# Patient Record
Sex: Female | Born: 1960 | Race: White | Hispanic: No | Marital: Married | Smoking: Former smoker
Health system: Southern US, Community
[De-identification: ages and names within clinical notes are randomized; demographics above are authoritative.]

## PROBLEM LIST (undated history)

## (undated) DIAGNOSIS — Z789 Other specified health status: Secondary | ICD-10-CM

## (undated) HISTORY — PX: DILATION AND CURETTAGE OF UTERUS: SHX78

---

## 2013-04-10 ENCOUNTER — Ambulatory Visit: Payer: Self-pay | Admitting: Internal Medicine

## 2016-04-08 ENCOUNTER — Other Ambulatory Visit: Payer: Self-pay | Admitting: Family Medicine

## 2016-04-08 ENCOUNTER — Telehealth: Payer: Self-pay | Admitting: Gastroenterology

## 2016-04-08 DIAGNOSIS — Z1231 Encounter for screening mammogram for malignant neoplasm of breast: Secondary | ICD-10-CM

## 2016-04-08 NOTE — Telephone Encounter (Signed)
Called patient. Voicemail is full. Will try calling again at a later time

## 2016-04-08 NOTE — Telephone Encounter (Signed)
colonoscopy

## 2016-04-10 ENCOUNTER — Other Ambulatory Visit: Payer: Self-pay

## 2016-04-10 NOTE — Telephone Encounter (Signed)
Received a referral from Dr. Ellison Hughs to schedule patient for Z12.11   Called patient and left a voicemail.

## 2016-04-14 ENCOUNTER — Telehealth: Payer: Self-pay | Admitting: Gastroenterology

## 2016-04-14 NOTE — Telephone Encounter (Signed)
Called patient and left a voicemail. Notification letter has been sent 

## 2016-04-14 NOTE — Telephone Encounter (Signed)
Patient is returning your phone call. 

## 2016-04-15 ENCOUNTER — Other Ambulatory Visit: Payer: Self-pay

## 2016-04-15 NOTE — Telephone Encounter (Signed)
Patient is returning call to schedule colonoscopy  

## 2016-04-15 NOTE — Telephone Encounter (Signed)
Screening Colonoscopy Z12.11 [facility] [id number] [employer]  Pre cert is not required

## 2016-04-15 NOTE — Telephone Encounter (Signed)
Gastroenterology Pre-Procedure Review  Request Date: 05/23/2016 Requesting Physician:   PATIENT REVIEW QUESTIONS: The patient responded to the following health history questions as indicated:    1. Are you having any GI issues? no 2. Do you have a personal history of Polyps? no 3. Do you have a family history of Colon Cancer or Polyps? yes (Mother and Father benign polyps) 4. Diabetes Mellitus? no 5. Joint replacements in the past 12 months?no 6. Major health problems in the past 3 months?no 7. Any artificial heart valves, MVP, or defibrillator?no    MEDICATIONS & ALLERGIES:    Patient reports the following regarding taking any anticoagulation/antiplatelet therapy:   Plavix, Coumadin, Eliquis, Xarelto, Lovenox, Pradaxa, Brilinta, or Effient? no Aspirin? no  Patient confirms/reports the following medications:  Current Outpatient Prescriptions  Medication Sig Dispense Refill  . Cholecalciferol (VITAMIN D-1000 MAX ST) 1000 units tablet Take by mouth.     No current facility-administered medications for this visit.     Patient confirms/reports the following allergies:  No Known Allergies  No orders of the defined types were placed in this encounter.   AUTHORIZATION INFORMATION Primary Insurance: 1D#: Group #:  Secondary Insurance: 1D#: Group #:  SCHEDULE INFORMATION: Date: 05/23/2016 Time: Location: [facility]

## 2016-04-24 ENCOUNTER — Ambulatory Visit
Admission: RE | Admit: 2016-04-24 | Discharge: 2016-04-24 | Disposition: A | Discharge status: Home / Self Care | Payer: Managed Care, Other (non HMO) | Source: Ambulatory Visit | Attending: Family Medicine | Admitting: Family Medicine

## 2016-04-24 ENCOUNTER — Other Ambulatory Visit: Payer: Self-pay | Admitting: Family Medicine

## 2016-04-24 DIAGNOSIS — Z1231 Encounter for screening mammogram for malignant neoplasm of breast: Secondary | ICD-10-CM

## 2016-05-01 ENCOUNTER — Ambulatory Visit
Admission: RE | Admit: 2016-05-01 | Discharge: 2016-05-01 | Disposition: A | Discharge status: Home / Self Care | Payer: Self-pay | Source: Ambulatory Visit | Attending: *Deleted | Admitting: *Deleted

## 2016-05-01 ENCOUNTER — Other Ambulatory Visit: Payer: Self-pay | Admitting: *Deleted

## 2016-05-01 DIAGNOSIS — Z1231 Encounter for screening mammogram for malignant neoplasm of breast: Secondary | ICD-10-CM

## 2016-05-19 ENCOUNTER — Encounter: Payer: Self-pay | Admitting: *Deleted

## 2016-05-22 NOTE — Discharge Instructions (Signed)

## 2016-05-23 ENCOUNTER — Encounter
Admission: RE | Disposition: A | Discharge status: Home / Self Care | Payer: Self-pay | Source: Ambulatory Visit | Attending: Gastroenterology

## 2016-05-23 ENCOUNTER — Ambulatory Visit: Payer: Managed Care, Other (non HMO) | Admitting: Anesthesiology

## 2016-05-23 ENCOUNTER — Ambulatory Visit
Admission: RE | Admit: 2016-05-23 | Discharge: 2016-05-23 | Disposition: A | Discharge status: Home / Self Care | Payer: Managed Care, Other (non HMO) | Source: Ambulatory Visit | Attending: Gastroenterology | Admitting: Gastroenterology

## 2016-05-23 DIAGNOSIS — K641 Second degree hemorrhoids: Secondary | ICD-10-CM | POA: Diagnosis not present

## 2016-05-23 DIAGNOSIS — D123 Benign neoplasm of transverse colon: Secondary | ICD-10-CM | POA: Diagnosis not present

## 2016-05-23 DIAGNOSIS — Z87891 Personal history of nicotine dependence: Secondary | ICD-10-CM | POA: Insufficient documentation

## 2016-05-23 DIAGNOSIS — Z1211 Encounter for screening for malignant neoplasm of colon: Secondary | ICD-10-CM | POA: Diagnosis not present

## 2016-05-23 HISTORY — PX: POLYPECTOMY: SHX5525

## 2016-05-23 HISTORY — PX: COLONOSCOPY WITH PROPOFOL: SHX5780

## 2016-05-23 HISTORY — DX: Other specified health status: Z78.9

## 2016-05-23 SURGERY — COLONOSCOPY WITH PROPOFOL
Anesthesia: Monitor Anesthesia Care | Wound class: Contaminated

## 2016-05-23 MED ORDER — PROPOFOL 10 MG/ML IV BOLUS
INTRAVENOUS | Status: DC | PRN
  Administered 2016-05-23: 40 mg via INTRAVENOUS
  Administered 2016-05-23: 30 mg via INTRAVENOUS
  Administered 2016-05-23 (×2): 50 mg via INTRAVENOUS
  Administered 2016-05-23: 80 mg via INTRAVENOUS

## 2016-05-23 MED ORDER — STERILE WATER FOR IRRIGATION IR SOLN
Status: DC | PRN
  Administered 2016-05-23: 10:00:00

## 2016-05-23 MED ORDER — LIDOCAINE HCL (CARDIAC) 20 MG/ML IV SOLN
INTRAVENOUS | Status: DC | PRN
  Administered 2016-05-23: 50 mg via INTRAVENOUS

## 2016-05-23 MED ORDER — LACTATED RINGERS IV SOLN
INTRAVENOUS | Status: DC
  Administered 2016-05-23: 10:00:00 via INTRAVENOUS

## 2016-05-23 SURGICAL SUPPLY — 23 items

## 2016-05-23 NOTE — Anesthesia Postprocedure Evaluation (Signed)
Anesthesia Post Note  Patient: Mia Taylor  Procedure(s) Performed: Procedure(s) (LRB): COLONOSCOPY WITH PROPOFOL (N/A) POLYPECTOMY  Patient location during evaluation: PACU Anesthesia Type: MAC Level of consciousness: awake and alert Pain management: pain level controlled Vital Signs Assessment: post-procedure vital signs reviewed and stable Respiratory status: spontaneous breathing, nonlabored ventilation, respiratory function stable and patient connected to nasal cannula oxygen Cardiovascular status: stable and blood pressure returned to baseline Anesthetic complications: no    Trecia Rogers

## 2016-05-23 NOTE — Anesthesia Procedure Notes (Signed)
Procedure Name: MAC Date/Time: 05/23/2016 9:49 AM Performed by: Janna Arch Pre-anesthesia Checklist: Patient identified, Emergency Drugs available, Suction available and Patient being monitored Patient Re-evaluated:Patient Re-evaluated prior to inductionOxygen Delivery Method: Nasal cannula

## 2016-05-23 NOTE — H&P (Signed)
  Lucilla Lame, MD [facility] [address], [place], [place] [address] Phone: [phone] Fax : [phone]  Primary Care Physician:  G Werber [facility], MD Primary Gastroenterologist:  Dr. Allen Norris  Pre-Procedure History & Physical: HPI:  Mia Taylor is a 55 y.o. female is here for a screening colonoscopy.   Past Medical History:  Diagnosis Date  . Medical history non-contributory     Past Surgical History:  Procedure Laterality Date  . DILATION AND CURETTAGE OF UTERUS      Prior to Admission medications   Medication Sig Start Date End Date Taking? Authorizing Provider  Cholecalciferol (VITAMIN D-1000 MAX ST) 1000 units tablet Take by mouth.   Yes Historical Provider, MD    Allergies as of 04/15/2016  . (No Known Allergies)    Family History  Problem Relation Age of Onset  . Breast cancer Neg Hx     Social History   Social History  . Marital status: Married    Spouse name: N/A  . Number of children: N/A  . Years of education: N/A   Occupational History  . Not on file.   Social History Main Topics  . Smoking status: Former Research scientist (life sciences)  . Smokeless tobacco: Never Used     Comment: socially over 25 yrs ago  . Alcohol use 3.0 oz/week    5 Glasses of wine per week  . Drug use: Unknown  . Sexual activity: Not on file   Other Topics Concern  . Not on file   Social History Narrative  . No narrative on file    Review of Systems: See HPI, otherwise negative ROS  Physical Exam: BP 115/68   Pulse 70   Temp 98.4 F (36.9 C) (Temporal)   Ht 5\' 4"  (1.626 m)   Wt 122 lb (55.3 kg)   SpO2 99%   BMI 20.94 kg/m  General:   Alert,  pleasant and cooperative in NAD Head:  Normocephalic and atraumatic. Neck:  Supple; no masses or thyromegaly. Lungs:  Clear throughout to auscultation.    Heart:  Regular rate and rhythm. Abdomen:  Soft, nontender and nondistended. Normal bowel sounds, without guarding, and without rebound.   Neurologic:  Alert and  oriented x4;   grossly normal neurologically.  Impression/Plan: Mia Taylor is now here to undergo a screening colonoscopy.  Risks, benefits, and alternatives regarding colonoscopy have been reviewed with the patient.  Questions have been answered.  All parties agreeable.

## 2016-05-23 NOTE — Op Note (Signed)
[facility] Patient Name: Mia Taylor Procedure Date: 05/23/2016 9:41 AM MRN: [id number] Account #: [id number] Date of Birth: 09-03-1960 Admit Type: Outpatient Age: 55 Room: [facility] OR ROOM 01 Gender: Female Note Status: Finalized Procedure:            Colonoscopy Indications:          Screening for colorectal malignant neoplasm Providers:            Lucilla Lame MD, MD Referring MD:         Sofie Hartigan (Referring MD) Medicines:            Propofol per Anesthesia Complications:        No immediate complications. Procedure:            Pre-Anesthesia Assessment:                       - Prior to the procedure, a History and Physical was                        performed, and patient medications and allergies were                        reviewed. The patient's tolerance of previous                        anesthesia was also reviewed. The risks and benefits of                        the procedure and the sedation options and risks were                        discussed with the patient. All questions were                        answered, and informed consent was obtained. Prior                        Anticoagulants: The patient has taken no previous                        anticoagulant or antiplatelet agents. ASA Grade                        Assessment: II - A patient with mild systemic disease.                        After reviewing the risks and benefits, the patient was                        deemed in satisfactory condition to undergo the                        procedure.                       After obtaining informed consent, the colonoscope was                        passed under direct vision. Throughout the procedure,  the patient's blood pressure, pulse, and oxygen                        saturations were monitored continuously. The Olympus CF                        H180AL colonoscope (S#: [id number]) was introduced  through                        the anus and advanced to the the cecum, identified by                        appendiceal orifice and ileocecal valve. The                        colonoscopy was performed without difficulty. The                        patient tolerated the procedure well. The quality of                        the bowel preparation was excellent. Findings:      The perianal and digital rectal examinations were normal.      A 5 mm polyp was found in the transverse colon. The polyp was sessile.       The polyp was removed with a cold biopsy forceps. Resection and       retrieval were complete.      Non-bleeding internal hemorrhoids were found during retroflexion. The       hemorrhoids were Grade II (internal hemorrhoids that prolapse but reduce       spontaneously). Impression:           - One 5 mm polyp in the transverse colon, removed with                        a cold biopsy forceps. Resected and retrieved.                       - Non-bleeding internal hemorrhoids. Recommendation:       - Discharge patient to home.                       - Resume previous diet.                       - Await pathology results.                       - Repeat colonoscopy in 5 years if polyp adenoma and 10                        years if hyperplastic Procedure Code(s):    --- Professional ---                       [phone], Colonoscopy, flexible; with biopsy, single or                        multiple Diagnosis Code(s):    --- Professional ---  Z12.11, Encounter for screening for malignant neoplasm                        of colon                       D12.3, Benign neoplasm of transverse colon (hepatic                        flexure or splenic flexure) CPT copyright 2016 American Medical Association. All rights reserved. The codes documented in this report are preliminary and upon coder review may  be revised to meet current compliance requirements. Lucilla Lame MD,  MD 05/23/2016 10:04:37 AM This report has been signed electronically. Number of Addenda: 0 Note Initiated On: 05/23/2016 9:41 AM Scope Withdrawal Time: 0 hours 6 minutes 49 seconds  Total Procedure Duration: 0 hours 10 minutes 55 seconds       [facility]

## 2016-05-23 NOTE — Transfer of Care (Signed)
Immediate Anesthesia Transfer of Care Note  Patient: Mia Taylor  Procedure(s) Performed: Procedure(s): COLONOSCOPY WITH PROPOFOL (N/A) POLYPECTOMY  Patient Location: PACU  Anesthesia Type: MAC  Level of Consciousness: awake, alert  and patient cooperative  Airway and Oxygen Therapy: Patient Spontanous Breathing and Patient connected to supplemental oxygen  Post-op Assessment: Post-op Vital signs reviewed, Patient's Cardiovascular Status Stable, Respiratory Function Stable, Patent Airway and No signs of Nausea or vomiting  Post-op Vital Signs: Reviewed and stable  Complications: No apparent anesthesia complications

## 2016-05-23 NOTE — Anesthesia Preprocedure Evaluation (Signed)
Anesthesia Evaluation  Patient identified by MRN, date of birth, ID band Patient awake    Reviewed: Allergy & Precautions, H&P , NPO status , Patient's Chart, lab work & pertinent test results, reviewed documented beta blocker date and time   Airway Mallampati: I  TM Distance: >3 FB Neck ROM: full    Dental no notable dental hx.    Pulmonary neg pulmonary ROS, former smoker,    Pulmonary exam normal breath sounds clear to auscultation       Cardiovascular Exercise Tolerance: Good negative cardio ROS   Rhythm:regular Rate:Normal     Neuro/Psych negative neurological ROS  negative psych ROS   GI/Hepatic negative GI ROS, Neg liver ROS,   Endo/Other  negative endocrine ROS  Renal/GU negative Renal ROS  negative genitourinary   Musculoskeletal   Abdominal   Peds  Hematology negative hematology ROS (+)   Anesthesia Other Findings   Reproductive/Obstetrics negative OB ROS                             Anesthesia Physical Anesthesia Plan  ASA: I  Anesthesia Plan: MAC   Post-op Pain Management:    Induction:   Airway Management Planned:   Additional Equipment:   Intra-op Plan:   Post-operative Plan:   Informed Consent: I have reviewed the patients History and Physical, chart, labs and discussed the procedure including the risks, benefits and alternatives for the proposed anesthesia with the patient or authorized representative who has indicated his/her understanding and acceptance.   Dental Advisory Given  Plan Discussed with: CRNA and Anesthesiologist  Anesthesia Plan Comments:         Anesthesia Quick Evaluation

## 2016-05-26 ENCOUNTER — Encounter: Payer: Self-pay | Admitting: Gastroenterology

## 2016-05-29 ENCOUNTER — Encounter: Payer: Self-pay | Admitting: Gastroenterology

## 2016-06-01 ENCOUNTER — Encounter: Payer: Self-pay | Admitting: Gastroenterology

## 2017-05-26 ENCOUNTER — Other Ambulatory Visit: Payer: Self-pay | Admitting: Family Medicine

## 2017-08-20 ENCOUNTER — Other Ambulatory Visit: Payer: Self-pay | Admitting: Family Medicine

## 2017-08-20 DIAGNOSIS — Z1231 Encounter for screening mammogram for malignant neoplasm of breast: Secondary | ICD-10-CM

## 2017-08-24 ENCOUNTER — Ambulatory Visit
Admission: RE | Admit: 2017-08-24 | Discharge: 2017-08-24 | Disposition: A | Discharge status: Home / Self Care | Payer: 59 | Source: Ambulatory Visit | Attending: Family Medicine | Admitting: Family Medicine

## 2017-08-24 DIAGNOSIS — Z1231 Encounter for screening mammogram for malignant neoplasm of breast: Secondary | ICD-10-CM | POA: Insufficient documentation

## 2018-10-27 ENCOUNTER — Other Ambulatory Visit: Payer: Self-pay | Admitting: Family Medicine

## 2018-10-27 DIAGNOSIS — R1032 Left lower quadrant pain: Secondary | ICD-10-CM

## 2018-10-27 DIAGNOSIS — N9489 Other specified conditions associated with female genital organs and menstrual cycle: Secondary | ICD-10-CM

## 2018-11-03 ENCOUNTER — Ambulatory Visit: Payer: 59

## 2018-11-12 ENCOUNTER — Other Ambulatory Visit: Payer: Self-pay

## 2018-11-12 ENCOUNTER — Encounter: Payer: Self-pay | Admitting: Emergency Medicine

## 2018-11-12 ENCOUNTER — Ambulatory Visit
Admission: EM | Admit: 2018-11-12 | Discharge: 2018-11-12 | Disposition: A | Discharge status: Home / Self Care | Payer: 59 | Attending: Family Medicine | Admitting: Family Medicine

## 2018-11-12 DIAGNOSIS — R102 Pelvic and perineal pain: Secondary | ICD-10-CM

## 2018-11-12 DIAGNOSIS — Z3202 Encounter for pregnancy test, result negative: Secondary | ICD-10-CM

## 2018-11-12 LAB — URINALYSIS, COMPLETE (UACMP) WITH MICROSCOPIC
Bacteria, UA: NONE SEEN
Bilirubin Urine: NEGATIVE
Glucose, UA: NEGATIVE mg/dL
Ketones, ur: NEGATIVE mg/dL
Leukocytes,Ua: NEGATIVE
Nitrite: NEGATIVE
Protein, ur: NEGATIVE mg/dL
Specific Gravity, Urine: <1.005 — ABNORMAL LOW (ref 1.005–1.030)
pH: 5.5 (ref 5.0–8.0)

## 2018-11-12 LAB — WET PREP, GENITAL
Clue Cells Wet Prep HPF POC: NONE SEEN
Sperm: NONE SEEN
Trich, Wet Prep: NONE SEEN
Yeast Wet Prep HPF POC: NONE SEEN

## 2018-11-12 LAB — PREGNANCY, URINE: Preg Test, Ur: NEGATIVE

## 2018-11-12 MED ORDER — HYDROCODONE-ACETAMINOPHEN 5-325 MG PO TABS: ORAL_TABLET | ORAL | 0 refills | Status: DC

## 2018-11-12 NOTE — ED Triage Notes (Signed)
Pt c/o lower abdominal pain. Started about 3 weeks ago. She has seen her PCP and was treated with bactrim for UTI after a culture was sent off. She did complete the coarse of the abx. This started after an episode of diarrhea from prior antibiotic for sinus infection. The abdominal pain and pressure in bladder area started back 2 days ago and she called her PCP to get a refill on the bactrim but has not helped this time. Her PCP did do an u/s and came back normal per patient.

## 2018-11-12 NOTE — ED Provider Notes (Signed)
[facility]    CSN: [id number] Arrival date & time: 11/12/18  0856     History   Chief Complaint Chief Complaint  Patient presents with  . Pelvic Pain    APPT     HPI Mia Taylor is a 58 y.o. female.   58 yo female with a c/o pelvic pains for the past 3 weeks. States pain is in the middle (around the bladder area) and was treated for a UTI by PCP, however pain has remained. States she is also being treated with Bentyl which she states has helped ease her symptoms. Denies any fevers, chills, vomiting, constipation, melena, hematochezia, dysuria. Patient also had a pelvic US done about 2 weeks ago which was negative/normal.  PMH: h/o "precancerous polyp" per patient on colonoscopy 3 years ago. Patient states she's due for that this year again.   The history is provided by the patient.  Pelvic Pain  Episode onset: 3 weeks ago.    Past Medical History:  Diagnosis Date  . Medical history non-contributory     Patient Active Problem List   Diagnosis Date Noted  . Special screening for malignant neoplasms, colon   . Benign neoplasm of transverse colon     Past Surgical History:  Procedure Laterality Date  . COLONOSCOPY WITH PROPOFOL N/A 05/23/2016   Procedure: COLONOSCOPY WITH PROPOFOL;  Surgeon: Lucilla Lame, MD;  Location: [facility];  Service: Endoscopy;  Laterality: N/A;  . DILATION AND CURETTAGE OF UTERUS    . POLYPECTOMY  05/23/2016   Procedure: POLYPECTOMY;  Surgeon: Lucilla Lame, MD;  Location: [facility];  Service: Endoscopy;;    OB History   No obstetric history on file.      Home Medications    Prior to Admission medications   Medication Sig Start Date End Date Taking? Authorizing Provider  Cholecalciferol (VITAMIN D-1000 MAX ST) 1000 units tablet Take by mouth.   Yes [provider]  dicyclomine (BENTYL) 10 MG capsule Take 10 mg by mouth 4 (four) times daily -  before meals and at bedtime.   Yes [provider]  HYDROcodone-acetaminophen (NORCO/VICODIN) 5-325 MG tablet 1-2 tabs po bid prn 11/12/18   Norval Gable, MD    Family History Family History  Problem Relation Age of Onset  . Healthy Mother   . Healthy Father   . Breast cancer Neg Hx     Social History Social History   Tobacco Use  . Smoking status: Former Research scientist (life sciences)  . Smokeless tobacco: Never Used  . Tobacco comment: socially over 25 yrs ago  Substance Use Topics  . Alcohol use: Yes    Alcohol/week: 5.0 standard drinks    Types: 5 Glasses of wine per week  . Drug use: Not Currently     Allergies   Patient has no known allergies.   Review of Systems Review of Systems  Genitourinary: Positive for pelvic pain.     Physical Exam Triage Vital Signs ED Triage Vitals  Enc Vitals Group     BP 11/12/18 0921 (!) 153/69     Pulse Rate 11/12/18 0921 68     Resp 11/12/18 0921 16     Temp 11/12/18 0921 97.9 F (36.6 C)     Temp Source 11/12/18 0921 Oral     SpO2 11/12/18 0921 100 %     Weight 11/12/18 0917 122 lb (55.3 kg)     Height 11/12/18 0917 5\' 4"  (1.626 m)     Head Circumference --  Peak Flow --      Pain Score 11/12/18 0917 7     Pain Loc --      Pain Edu? --      Excl. in [place]? --    No data found.  Updated Vital Signs BP (!) 153/69 (BP Location: Left Arm)   Pulse 68   Temp 97.9 F (36.6 C) (Oral)   Resp 16   Ht 5\' 4"  (1.626 m)   Wt 55.3 kg   SpO2 100%   BMI 20.94 kg/m   Visual Acuity Right Eye Distance:   Left Eye Distance:   Bilateral Distance:    Right Eye Near:   Left Eye Near:    Bilateral Near:     Physical Exam Vitals signs and nursing note reviewed.  Constitutional:      General: She is not in acute distress.    Appearance: She is well-developed. She is not ill-appearing, toxic-appearing or diaphoretic.  Abdominal:     General: Bowel sounds are normal. There is no distension.     Palpations: Abdomen is soft. There is no mass.     Tenderness: There is abdominal  tenderness (lower mid abdomen area (mid pelvis area)). There is no right CVA tenderness, left CVA tenderness, guarding or rebound.     Hernia: No hernia is present.  Neurological:     Mental Status: She is alert.      UC Treatments / Results  Labs (all labs ordered are listed, but only abnormal results are displayed) Labs Reviewed  WET PREP, GENITAL - Abnormal; Notable for the following components:      Result Value   WBC, Wet Prep HPF POC FEW (*)    All other components within normal limits  URINALYSIS, COMPLETE (UACMP) WITH MICROSCOPIC - Abnormal; Notable for the following components:   Specific Gravity, Urine <1.005 (*)    Hgb urine dipstick TRACE (*)    All other components within normal limits  PREGNANCY, URINE    EKG None  Radiology No results found.  Procedures Procedures (including critical care time)  Medications Ordered in UC Medications - No data to display  Initial Impression / Assessment and Plan / UC Course  I have reviewed the triage vital signs and the nursing notes.  Pertinent labs & imaging results that were available during my care of the patient were reviewed by me and considered in my medical decision making (see chart for details).     Final Clinical Impressions(s) / UC Diagnoses   Final diagnoses:  Pelvic pain in female     Discharge Instructions     Continue Bentyl and increased fiber plus water Follow up with Gastroenterologist and Gynecologist    ED Prescriptions    Medication Sig Dispense Auth. Provider   HYDROcodone-acetaminophen (NORCO/VICODIN) 5-325 MG tablet 1-2 tabs po bid prn 12 tablet Norval Gable, MD     1. Lab results and potential diagnoses reviewed with patient 2. rx as per orders above; reviewed possible side effects, interactions, risks and benefits  3. Recommend supportive treatment as above 4. Follow up with GI and GYN for further evaluation and management 5. Follow-up prn   Controlled Substance  Prescriptions [place] Controlled Substance Registry consulted? Not Applicable   Norval Gable, MD 11/12/18 [phone]

## 2018-11-12 NOTE — Discharge Instructions (Addendum)
Continue Bentyl and increased fiber plus water Follow up with Writer

## 2018-11-15 ENCOUNTER — Telehealth: Payer: Self-pay | Admitting: Gastroenterology

## 2018-11-15 NOTE — Telephone Encounter (Signed)
Pt left vm she wanted to schedule an appointment with Dr. Allen Norris she had gone to Urgent care for abdominal pain and wants to set up apt asap. Left vm for pt to call office and schedule Virtual visit

## 2018-11-16 ENCOUNTER — Ambulatory Visit (INDEPENDENT_AMBULATORY_CARE_PROVIDER_SITE_OTHER): Payer: 59 | Admitting: Gastroenterology

## 2018-11-16 ENCOUNTER — Other Ambulatory Visit: Payer: Self-pay

## 2018-11-16 ENCOUNTER — Encounter: Payer: Self-pay | Admitting: Gastroenterology

## 2018-11-16 DIAGNOSIS — R197 Diarrhea, unspecified: Secondary | ICD-10-CM

## 2018-11-16 DIAGNOSIS — L290 Pruritus ani: Secondary | ICD-10-CM

## 2018-11-16 NOTE — Progress Notes (Signed)
Mia Lame, MD [address]  [place]  [place], [place] [address]  Main: [phone]  Fax: [phone]    Gastroenterology Virtual/Video Visit  Referring Provider:     Lynnell Jude, MD Primary Care Physician:  Mia Jude, MD Primary Gastroenterologist:  Dr.Jevaughn Taylor [place] Reason for Consultation:     Diarrhea        HPI:    Virtual Visit via Video Note Location of the patient: Home Location of provider: Office  Participating persons: The patient myself and Mia Taylor.  I connected with Mia Taylor on 11/16/18 at 11:00 AM EDT by a video enabled telemedicine application and verified that I am speaking with the correct person using two identifiers.   I discussed the limitations of evaluation and management by telemedicine and the availability of in person appointments. The patient expressed understanding and agreed to proceed.  Verbal consent to proceed obtained.  History of Present Illness: Mia Taylor is a 58 y.o. female referred by Dr. Clemmie Taylor, Mia Jude, MD  for consultation & management of diarrhea with abdominal pain in the suprapubic area and perianal itching and burning.  The patient reports that she has been seen by her doctor for a sinus infection was started on antibiotic.  After starting the antibiotic she started to have diarrhea and was sent off for stool studies that showed her to be negative for C. difficile.  The patient then shortly after that had a yeast infection and what she thought was a urinary tract infection.  The patient states that her urinary culture came back positive and she was started again on antibiotics with continued diarrhea and abdominal pain.  The patient thought she was having significant bladder spasms and contacted her doctor who started her on dicyclomine.  The patient states that she has had episodes of constipation and regular bowel movements but still continues to have abdominal discomfort and perianal burning and itching.   The patient was seen by OB/GYN who reported that it was likely from a GI cause.  She also continues to have the suprapubic pain.  The patient has had a colonoscopy in the past by me and had a tubular adenoma found.  There is no report of any black stools or bloody stools.  Past Medical History:  Diagnosis Date   Medical history non-contributory     Past Surgical History:  Procedure Laterality Date   COLONOSCOPY WITH PROPOFOL N/A 05/23/2016   Procedure: COLONOSCOPY WITH PROPOFOL;  Surgeon: Mia Lame, MD;  Location: [facility];  Service: Endoscopy;  Laterality: N/A;   DILATION AND CURETTAGE OF UTERUS     POLYPECTOMY  05/23/2016   Procedure: POLYPECTOMY;  Surgeon: Mia Lame, MD;  Location: [facility];  Service: Endoscopy;;    Prior to Admission medications   Medication Sig Start Date End Date Taking? Authorizing Provider  Cholecalciferol (VITAMIN D-1000 MAX ST) 1000 units tablet Take by mouth.    [provider]  dicyclomine (BENTYL) 10 MG capsule Take 10 mg by mouth 4 (four) times daily -  before meals and at bedtime.    [provider]  HYDROcodone-acetaminophen (NORCO/VICODIN) 5-325 MG tablet 1-2 tabs po bid prn 11/12/18   Norval Gable, MD    Family History  Problem Relation Age of Onset   Healthy Mother    Healthy Father    Breast cancer Neg Hx      Social History   Tobacco Use   Smoking status: Former Smoker  Smokeless tobacco: Never Used   Tobacco comment: socially over 25 yrs ago  Substance Use Topics   Alcohol use: Yes    Alcohol/week: 5.0 standard drinks    Types: 5 Glasses of wine per week   Drug use: Not Currently    Allergies as of 11/16/2018   (No Known Allergies)    Review of Systems:    All systems reviewed and negative except where noted in HPI.   Observations/Objective:  Labs: CBC No results found for: WBC, RBC, HGB, HCT, PLT, MCV, MCH, MCHC, RDW, LYMPHSABS, MONOABS, EOSABS, BASOSABS CMP  No  results found for: NA, K, CL, CO2, GLUCOSE, BUN, CREATININE, CALCIUM, PROT, ALBUMIN, AST, ALT, ALKPHOS, BILITOT, GFRNONAA, GFRAA  Imaging Studies: No results found.  Assessment and Plan:   KERYL GHOLSON is a 58 y.o. y/o female who has seen me in the past for a colonoscopy with an adenomatous polyp found at that time.  The patient now was treated with 2 cycles of antibiotics one for a urinary tract infection and 1 for a sinus infection.  The patient has had diarrhea with abdominal pain and cramps.  The patient was started on dicyclomine and states that the abdominal pain and perianal itching that she is reporting has continued with some perianal burning but her diarrhea has improved.  She also thinks she may be a bit intubated due to taking some pain medication this weekend.  The patient will be sent off for a C. difficile test and a GI panel to look for any other pathogens that may be causing her to have a change in her bowel habits.  The patient will also continue the dicyclomine and has been told that if her symptoms do not improve or her stool studies do not show a cause she may need to come into the office to have her perianal area looked at.  The patient has been explained the plan and agrees with it.  Follow Up Instructions:  I discussed the assessment and treatment plan with the patient. The patient was provided an opportunity to ask questions and all were answered. The patient agreed with the plan and demonstrated an understanding of the instructions.   The patient was advised to call back or seek an in-person evaluation if the symptoms worsen or if the condition fails to improve as anticipated.  I provided 26 minutes of non-face-to-face time during this encounter.   Mia Lame, MD  Speech recognition software was used to dictate the above note.

## 2018-11-22 LAB — GI PROFILE, STOOL, PCR

## 2018-11-22 LAB — C DIFFICILE, CYTOTOXIN B

## 2018-11-22 LAB — C DIFFICILE TOXINS A+B W/RFLX: C difficile Toxins A+B, EIA: NEGATIVE

## 2018-11-23 ENCOUNTER — Ambulatory Visit: Payer: Managed Care, Other (non HMO) | Admitting: Urology

## 2019-05-10 ENCOUNTER — Other Ambulatory Visit: Payer: Self-pay | Admitting: Family Medicine

## 2019-05-10 DIAGNOSIS — Z1231 Encounter for screening mammogram for malignant neoplasm of breast: Secondary | ICD-10-CM

## 2019-07-20 ENCOUNTER — Ambulatory Visit
Admission: RE | Admit: 2019-07-20 | Discharge: 2019-07-20 | Disposition: A | Discharge status: Home / Self Care | Payer: 59 | Source: Ambulatory Visit | Attending: Family Medicine | Admitting: Family Medicine

## 2019-07-20 ENCOUNTER — Other Ambulatory Visit: Payer: Self-pay

## 2019-07-20 DIAGNOSIS — Z1231 Encounter for screening mammogram for malignant neoplasm of breast: Secondary | ICD-10-CM | POA: Insufficient documentation

## 2019-07-26 ENCOUNTER — Other Ambulatory Visit: Payer: Self-pay | Admitting: Family Medicine

## 2019-07-26 DIAGNOSIS — R928 Other abnormal and inconclusive findings on diagnostic imaging of breast: Secondary | ICD-10-CM

## 2019-07-26 DIAGNOSIS — N6489 Other specified disorders of breast: Secondary | ICD-10-CM

## 2019-07-27 ENCOUNTER — Ambulatory Visit
Admission: RE | Admit: 2019-07-27 | Discharge: 2019-07-27 | Disposition: A | Discharge status: Home / Self Care | Payer: 59 | Source: Ambulatory Visit | Attending: Family Medicine | Admitting: Family Medicine

## 2019-07-27 DIAGNOSIS — R928 Other abnormal and inconclusive findings on diagnostic imaging of breast: Secondary | ICD-10-CM

## 2019-07-27 DIAGNOSIS — N6489 Other specified disorders of breast: Secondary | ICD-10-CM

## 2019-07-28 ENCOUNTER — Other Ambulatory Visit: Payer: 59

## 2019-07-28 ENCOUNTER — Ambulatory Visit: Payer: 59

## 2020-06-28 ENCOUNTER — Other Ambulatory Visit: Payer: Self-pay | Admitting: Family Medicine

## 2020-06-28 DIAGNOSIS — Z1231 Encounter for screening mammogram for malignant neoplasm of breast: Secondary | ICD-10-CM

## 2020-07-30 ENCOUNTER — Inpatient Hospital Stay: Admission: RE | Admit: 2020-07-30 | Payer: 59 | Source: Ambulatory Visit

## 2020-08-03 ENCOUNTER — Ambulatory Visit: Payer: 59

## 2020-08-14 ENCOUNTER — Ambulatory Visit
Admission: RE | Admit: 2020-08-14 | Discharge: 2020-08-14 | Disposition: A | Discharge status: Home / Self Care | Payer: 59 | Source: Ambulatory Visit | Attending: Family Medicine | Admitting: Family Medicine

## 2020-08-14 ENCOUNTER — Other Ambulatory Visit: Payer: Self-pay

## 2020-08-14 DIAGNOSIS — Z1231 Encounter for screening mammogram for malignant neoplasm of breast: Secondary | ICD-10-CM | POA: Insufficient documentation

## 2021-08-07 ENCOUNTER — Other Ambulatory Visit: Payer: Self-pay | Admitting: Family Medicine

## 2021-08-07 DIAGNOSIS — Z1231 Encounter for screening mammogram for malignant neoplasm of breast: Secondary | ICD-10-CM

## 2021-09-17 ENCOUNTER — Ambulatory Visit
Admission: RE | Admit: 2021-09-17 | Discharge: 2021-09-17 | Disposition: A | Discharge status: Home / Self Care | Payer: 59 | Source: Ambulatory Visit | Attending: Family Medicine | Admitting: Family Medicine

## 2021-09-17 ENCOUNTER — Other Ambulatory Visit: Payer: Self-pay

## 2021-09-17 DIAGNOSIS — Z1231 Encounter for screening mammogram for malignant neoplasm of breast: Secondary | ICD-10-CM | POA: Insufficient documentation

## 2022-09-01 ENCOUNTER — Other Ambulatory Visit: Payer: Self-pay | Admitting: Family Medicine

## 2022-09-01 DIAGNOSIS — Z1231 Encounter for screening mammogram for malignant neoplasm of breast: Secondary | ICD-10-CM

## 2022-09-22 ENCOUNTER — Ambulatory Visit
Admission: RE | Admit: 2022-09-22 | Discharge: 2022-09-22 | Disposition: A | Discharge status: Home / Self Care | Payer: 59 | Source: Ambulatory Visit | Attending: Family Medicine | Admitting: Family Medicine

## 2022-09-22 DIAGNOSIS — Z1231 Encounter for screening mammogram for malignant neoplasm of breast: Secondary | ICD-10-CM | POA: Diagnosis present

## 2023-02-17 ENCOUNTER — Ambulatory Visit
Admission: RE | Admit: 2023-02-17 | Discharge: 2023-02-17 | Disposition: A | Discharge status: Home / Self Care | Payer: 59 | Source: Ambulatory Visit | Attending: Emergency Medicine | Admitting: Emergency Medicine

## 2023-02-17 VITALS — BP 129/73 | HR 65 | Temp 98.1°F | Resp 17 | Wt 120.0 lb

## 2023-02-17 DIAGNOSIS — J069 Acute upper respiratory infection, unspecified: Secondary | ICD-10-CM

## 2023-02-17 LAB — GROUP A STREP BY PCR: Group A Strep by PCR: NOT DETECTED

## 2023-02-17 LAB — SARS CORONAVIRUS 2 BY RT PCR: SARS Coronavirus 2 by RT PCR: NEGATIVE

## 2023-02-17 MED ORDER — PROMETHAZINE-DM 6.25-15 MG/5ML PO SYRP: 5.0000 mL | ORAL_SOLUTION | Freq: Four times a day (QID) | ORAL | 0 refills | Status: AC | PRN

## 2023-02-17 MED ORDER — BENZONATATE 100 MG PO CAPS: 200.0000 mg | ORAL_CAPSULE | Freq: Three times a day (TID) | ORAL | 0 refills | Status: AC

## 2023-02-17 MED ORDER — IPRATROPIUM BROMIDE 0.06 % NA SOLN: 2.0000 | Freq: Four times a day (QID) | NASAL | 12 refills | Status: AC

## 2023-02-17 NOTE — Discharge Instructions (Addendum)
Your tests today for COVID and strep were both negative.  I do believe you have a viral respiratory infection.  Use over-the-counter Tylenol and/or ibuprofen according to package instruction for any fever or pain you may be experiencing.  Use the Atrovent nasal spray, 2 squirts in each nostril every 6 hours, as needed for runny nose and postnasal drip.  Use the Tessalon Perles every 8 hours during the day.  Take them with a small sip of water.  They may give you some numbness to the base of your tongue or a metallic taste in your mouth, this is normal.  Use the Promethazine DM cough syrup at bedtime for cough and congestion.  It will make you drowsy so do not take it during the day.  Return for reevaluation or see your primary care provider for any new or worsening symptoms.

## 2023-02-17 NOTE — ED Triage Notes (Signed)
Sore Throat -cough, congested. 2 DAYS exposed to people with strepp recently

## 2023-02-17 NOTE — ED Provider Notes (Signed)
[facility]    CSN: [id number] Arrival date & time: 02/17/23  1257      History   Chief Complaint Chief Complaint  Patient presents with   Sore Throat    have a cough, congested. but was exposed to people with strepp recently - Entered by patient    HPI Mia Taylor is a 62 y.o. female.   HPI  62 year old female with no significant past medical history presents for evaluation of sore throat which started last night.  She also endorses some mild nasal congestion and a nonproductive cough.  No fever, shortness breath, or wheezing.  [place] and one of her sons girlfriends was complained that she was not feeling well and subsequently tested positive for strep.  Past Medical History:  Diagnosis Date   Medical history non-contributory     Patient Active Problem List   Diagnosis Date Noted   Special screening for malignant neoplasms, colon    Benign neoplasm of transverse colon     Past Surgical History:  Procedure Laterality Date   COLONOSCOPY WITH PROPOFOL N/A 05/23/2016   Procedure: COLONOSCOPY WITH PROPOFOL;  Surgeon: Midge Minium, MD;  Location: [facility];  Service: Endoscopy;  Laterality: N/A;   DILATION AND CURETTAGE OF UTERUS     POLYPECTOMY  05/23/2016   Procedure: POLYPECTOMY;  Surgeon: Midge Minium, MD;  Location: [facility];  Service: Endoscopy;;    OB History   No obstetric history on file.      Home Medications    Prior to Admission medications   Medication Sig Start Date End Date Taking? Authorizing Provider  benzonatate (TESSALON) 100 MG capsule Take 2 capsules (200 mg total) by mouth every 8 (eight) hours. 02/17/23  Yes Becky Augusta, NP  ipratropium (ATROVENT) 0.06 % nasal spray Place 2 sprays into both nostrils 4 (four) times daily. 02/17/23  Yes Becky Augusta, NP  promethazine-dextromethorphan (PROMETHAZINE-DM) 6.25-15 MG/5ML syrup Take 5 mLs by mouth 4 (four) times daily as needed. 02/17/23  Yes  Becky Augusta, NP  atorvastatin (LIPITOR) 20 MG tablet Take 20 mg by mouth daily.    [provider]    Family History Family History  Problem Relation Age of Onset   Healthy Mother    Healthy Father    Breast cancer Neg Hx     Social History Social History   Tobacco Use   Smoking status: Former   Smokeless tobacco: Never   Tobacco comments:    socially over 25 yrs ago  Vaping Use   Vaping status: Never Used  Substance Use Topics   Alcohol use: Yes    Alcohol/week: 5.0 standard drinks of alcohol    Types: 5 Glasses of wine per week   Drug use: Not Currently     Allergies   Patient has no known allergies.   Review of Systems Review of Systems  Constitutional:  Negative for fever.  HENT:  Positive for congestion, rhinorrhea and sore throat.   Respiratory:  Positive for cough. Negative for shortness of breath and wheezing.      Physical Exam Triage Vital Signs ED Triage Vitals  Encounter Vitals Group     BP      Systolic BP Percentile      Diastolic BP Percentile      Pulse      Resp      Temp      Temp src      SpO2  Weight      Height      Head Circumference      Peak Flow      Pain Score      Pain Loc      Pain Education      Exclude from Growth Chart    No data found.  Updated Vital Signs BP 129/73 (BP Location: Right Arm)   Pulse 65   Temp 98.1 F (36.7 C) (Oral)   Resp 17   Wt 120 lb (54.4 kg)   SpO2 98%   BMI 20.60 kg/m   Visual Acuity Right Eye Distance:   Left Eye Distance:   Bilateral Distance:    Right Eye Near:   Left Eye Near:    Bilateral Near:     Physical Exam Vitals and nursing note reviewed.  Constitutional:      Appearance: Normal appearance. She is not ill-appearing.  HENT:     Head: Normocephalic and atraumatic.     Right Ear: Tympanic membrane, ear canal and external ear normal. There is no impacted cerumen.     Left Ear: Tympanic membrane, ear canal and external ear normal. There is no impacted  cerumen.     Nose: Congestion and rhinorrhea present.     Comments: Nasal mucosa is erythematous and edematous with scant clear discharge in both nares.    Mouth/Throat:     Mouth: Mucous membranes are moist.     Pharynx: Oropharynx is clear. Posterior oropharyngeal erythema present. No oropharyngeal exudate.     Comments: Mild erythema to the posterior oropharynx with clear postnasal drip.  No exudate noted. Cardiovascular:     Rate and Rhythm: Normal rate and regular rhythm.     Pulses: Normal pulses.     Heart sounds: Normal heart sounds. No murmur heard.    No friction rub. No gallop.  Pulmonary:     Effort: Pulmonary effort is normal.     Breath sounds: Normal breath sounds. No wheezing, rhonchi or rales.  Musculoskeletal:     Cervical back: Normal range of motion and neck supple. No tenderness.  Lymphadenopathy:     Cervical: No cervical adenopathy.  Skin:    General: Skin is warm and dry.     Capillary Refill: Capillary refill takes less than 2 seconds.     Findings: No rash.  Neurological:     General: No focal deficit present.     Mental Status: She is alert and oriented to person, place, and time.      UC Treatments / Results  Labs (all labs ordered are listed, but only abnormal results are displayed) Labs Reviewed  GROUP A STREP BY PCR  SARS CORONAVIRUS 2 BY RT PCR    EKG   Radiology No results found.  Procedures Procedures (including critical care time)  Medications Ordered in UC Medications - No data to display  Initial Impression / Assessment and Plan / UC Course  I have reviewed the triage vital signs and the nursing notes.  Pertinent labs & imaging results that were available during my care of the patient were reviewed by me and considered in my medical decision making (see chart for details).   Patient is a nontoxic-appearing 35 old female presenting for evaluation of respiratory symptoms as outlined in HPI above.  She was recently exposed to  strep and her most prominent symptom is a sore throat so I will order a strep PCR.  She is also endorsing nasal congestion, runny nose, and  a mild nonproductive cough.  She is unaware of any COVID exposure though [place]  I will order a COVID PCR as well given her mixture of upper and lower respiratory symptoms.  Strep PCR is negative.  COVID PCR is negative.  I will discharge patient on the diagnosis of viral URI with cough and treated with adjuvant nasal spray, Tessalon Perles, Promethazine DM cough syrup.  Also over-the-counter Tylenol and/or ibuprofen as needed for pain.  Return precautions reviewed.   Final Clinical Impressions(s) / UC Diagnoses   Final diagnoses:  Viral URI with cough     Discharge Instructions      Your tests today for COVID and strep were both negative.  I do believe you have a viral respiratory infection.  Use over-the-counter Tylenol and/or ibuprofen according to package instruction for any fever or pain you may be experiencing.  Use the Atrovent nasal spray, 2 squirts in each nostril every 6 hours, as needed for runny nose and postnasal drip.  Use the Tessalon Perles every 8 hours during the day.  Take them with a small sip of water.  They may give you some numbness to the base of your tongue or a metallic taste in your mouth, this is normal.  Use the Promethazine DM cough syrup at bedtime for cough and congestion.  It will make you drowsy so do not take it during the day.  Return for reevaluation or see your primary care provider for any new or worsening symptoms.      ED Prescriptions     Medication Sig Dispense Auth. Provider   benzonatate (TESSALON) 100 MG capsule Take 2 capsules (200 mg total) by mouth every 8 (eight) hours. 21 capsule Becky Augusta, NP   ipratropium (ATROVENT) 0.06 % nasal spray Place 2 sprays into both nostrils 4 (four) times daily. 15 mL Becky Augusta, NP   promethazine-dextromethorphan (PROMETHAZINE-DM)  6.25-15 MG/5ML syrup Take 5 mLs by mouth 4 (four) times daily as needed. 118 mL Becky Augusta, NP      PDMP not reviewed this encounter.   Becky Augusta, NP 02/17/23 1357

## 2023-08-12 ENCOUNTER — Other Ambulatory Visit: Payer: Self-pay | Admitting: Family Medicine

## 2023-08-12 DIAGNOSIS — Z1231 Encounter for screening mammogram for malignant neoplasm of breast: Secondary | ICD-10-CM

## 2023-09-23 ENCOUNTER — Ambulatory Visit
Admission: RE | Admit: 2023-09-23 | Discharge: 2023-09-23 | Disposition: A | Discharge status: Home / Self Care | Payer: 59 | Source: Ambulatory Visit | Attending: Family Medicine | Admitting: Family Medicine

## 2023-09-23 DIAGNOSIS — Z1231 Encounter for screening mammogram for malignant neoplasm of breast: Secondary | ICD-10-CM | POA: Diagnosis present

## 2024-01-13 IMAGING — MG MM DIGITAL SCREENING BILAT W/ TOMO AND CAD
6 of 10 series · 6 of 30 positions shown · non-contrast
Comparison: Previous exam(s).

CLINICAL DATA: Screening.

EXAM:
DIGITAL SCREENING BILATERAL MAMMOGRAM WITH TOMOSYNTHESIS AND CAD
TECHNIQUE: Bilateral screening digital craniocaudal and mediolateral oblique
mammograms were obtained. Bilateral screening digital breast
tomosynthesis was performed. The images were evaluated with
computer-aided detection.

[R CC synth-2D]
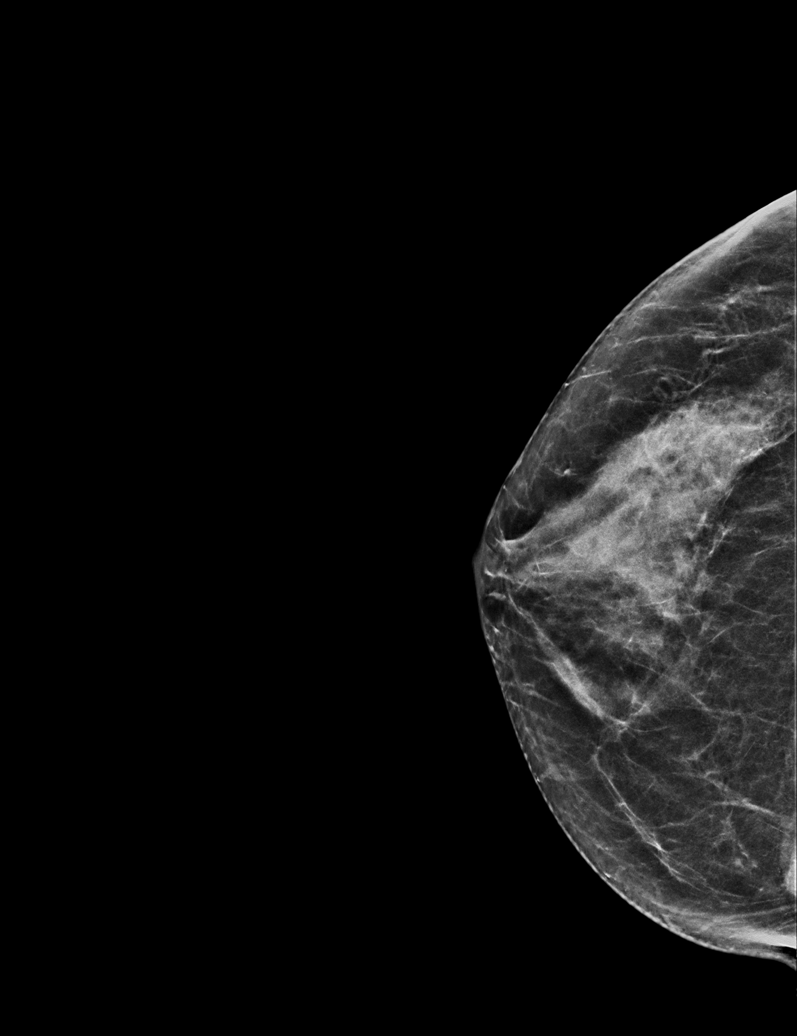

[L MLO synth-2D (1 of 2)]
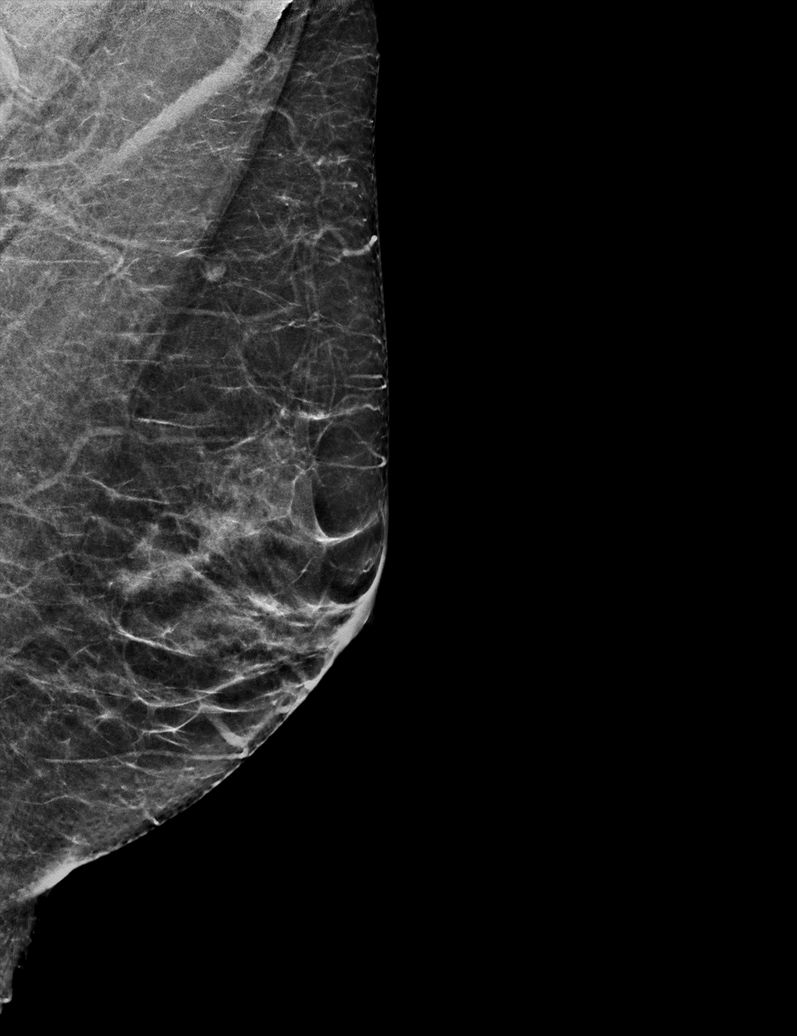

[R MLO synth-2D]
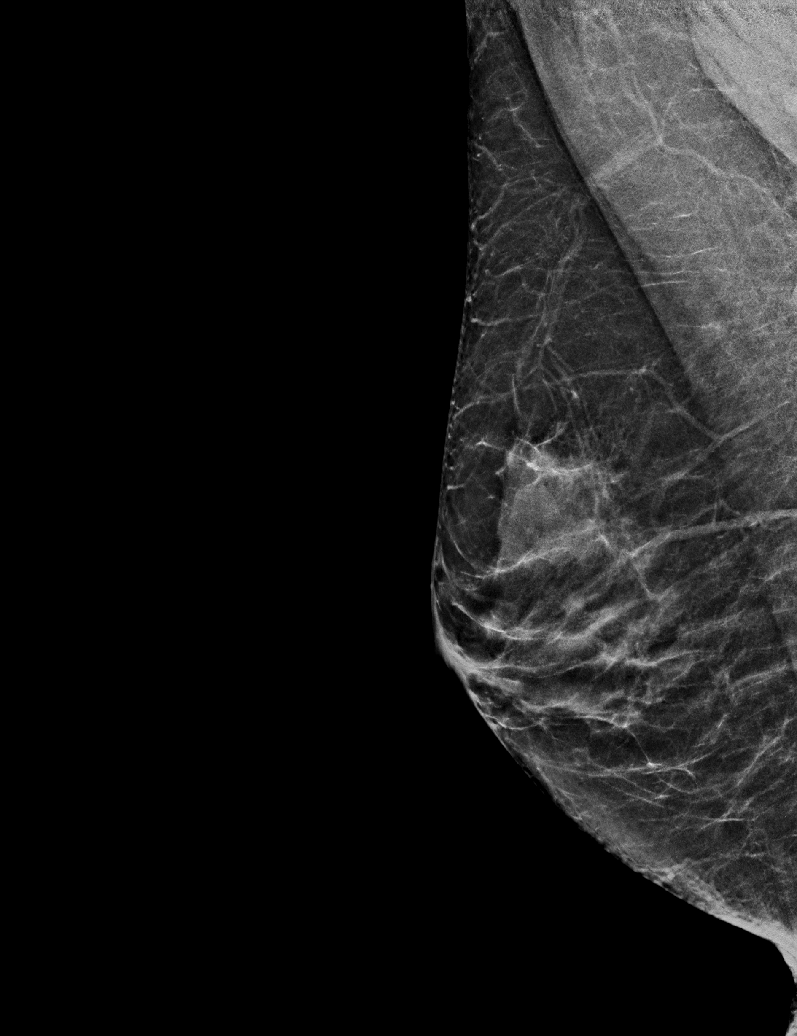

[L MLO synth-2D (2 of 2)]
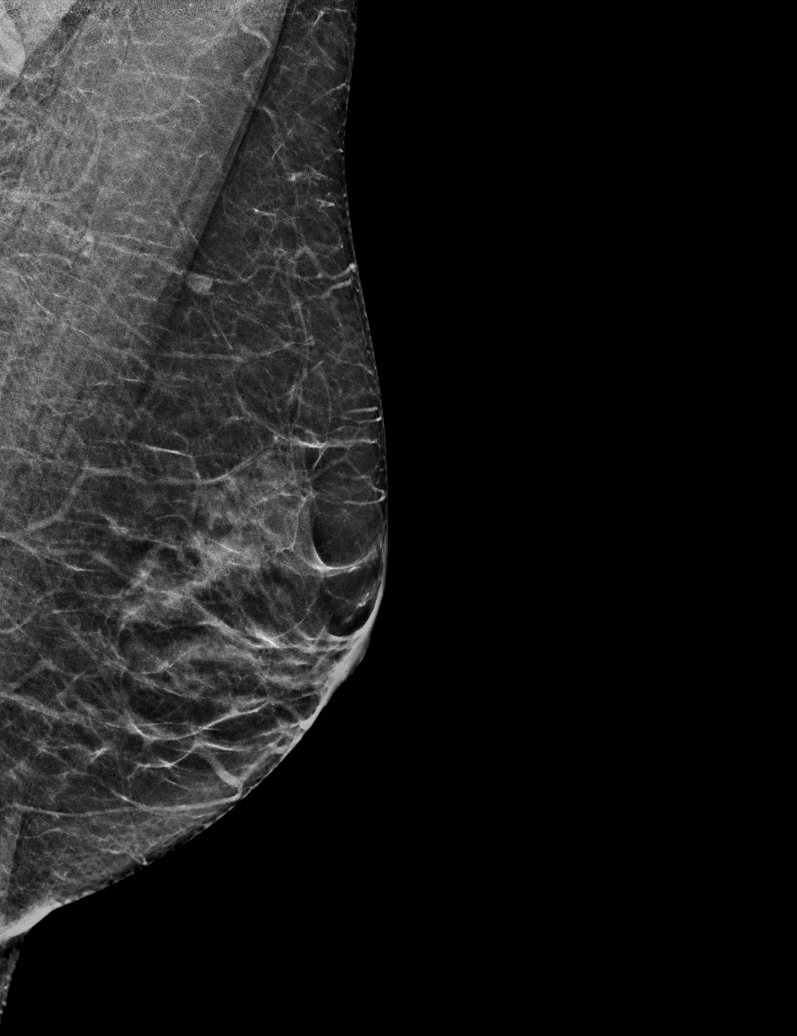

[L CC synth-2D]
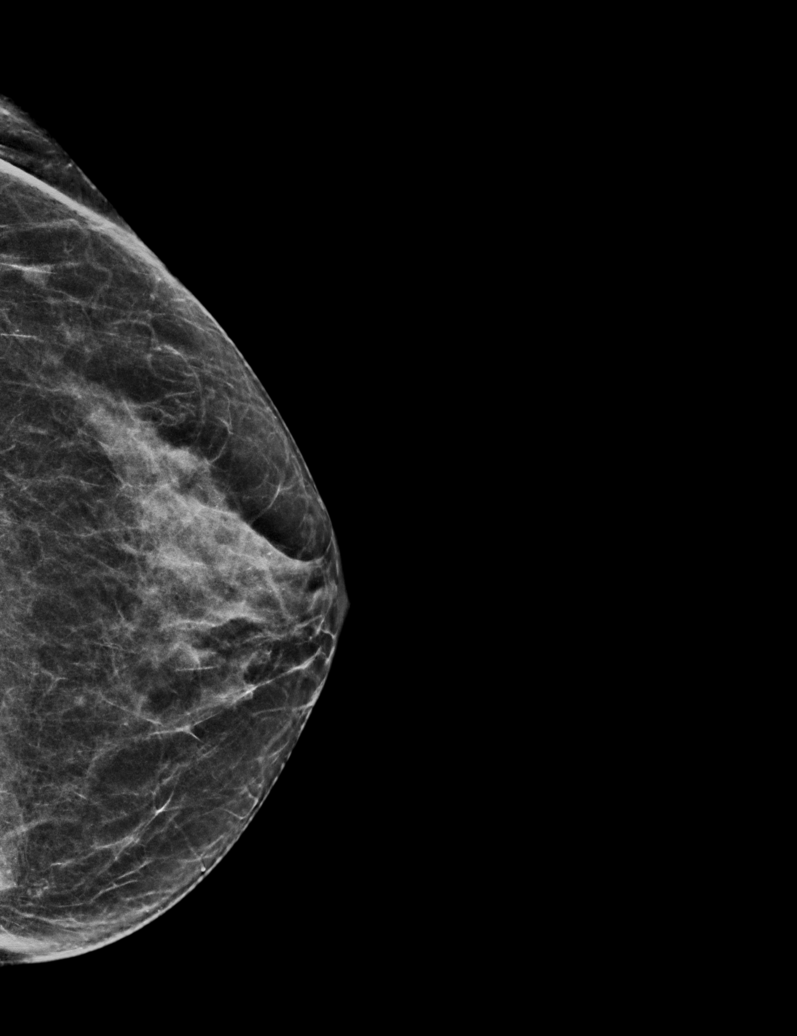

[L MLO tomo · tomo slice 25/48.0]
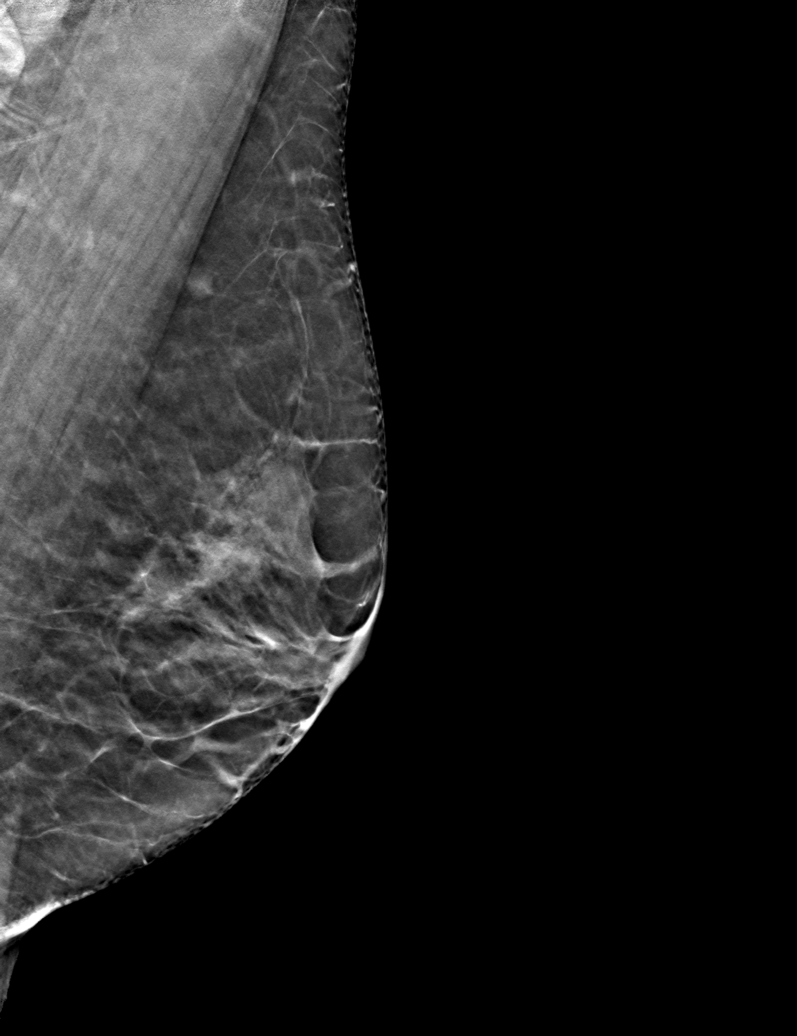

[6 of 30 positions shown; findings below may reference images not displayed]

ACR Breast Density Category c: The breast tissue is heterogeneously
dense, which may obscure small masses.
FINDINGS: There are no findings suspicious for malignancy.
IMPRESSION: No mammographic evidence of malignancy. A result letter of this
screening mammogram will be mailed directly to the patient.

RECOMMENDATION:
Screening mammogram in one year. (Code:Q3-W-BC3)

BI-RADS CATEGORY  1: Negative.
